# Patient Record
Sex: Female | Born: 1985 | Race: White | Hispanic: No | Marital: Single | Smoking: Former smoker
Health system: Southern US, Community
[De-identification: ages and names within clinical notes are randomized; demographics above are authoritative.]

---
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
## 2007-06-17 IMAGING — CR RIGHT FOOT COMPLETE - 3+ VIEW
1 series · 3 of 3 positions shown · non-contrast
Comparison: none

COMMENTS:

PROCEDURE:     DXR - DXR FOOT RT COMPLETE W/OBLIQUES  - April 27, 2005  [DATE]
RESULT:     There does not appear to be evidence of fracture, dislocation,
or malalignment. If there is persistent clinical concern or persistent
complaints of pain, repeat evaluation in 7-10 days is recommended if
clinically warranted. Note: the previously described lucency along the base
of the distal phalanx of the great toe is not appreciated on this study
though this study was not designed specifically for great toe evaluation as
coned down views of the great toe are.

[Series 1: view not recorded · 0.17mm/px · 3 of 3 slices shown]
[im 1/3]
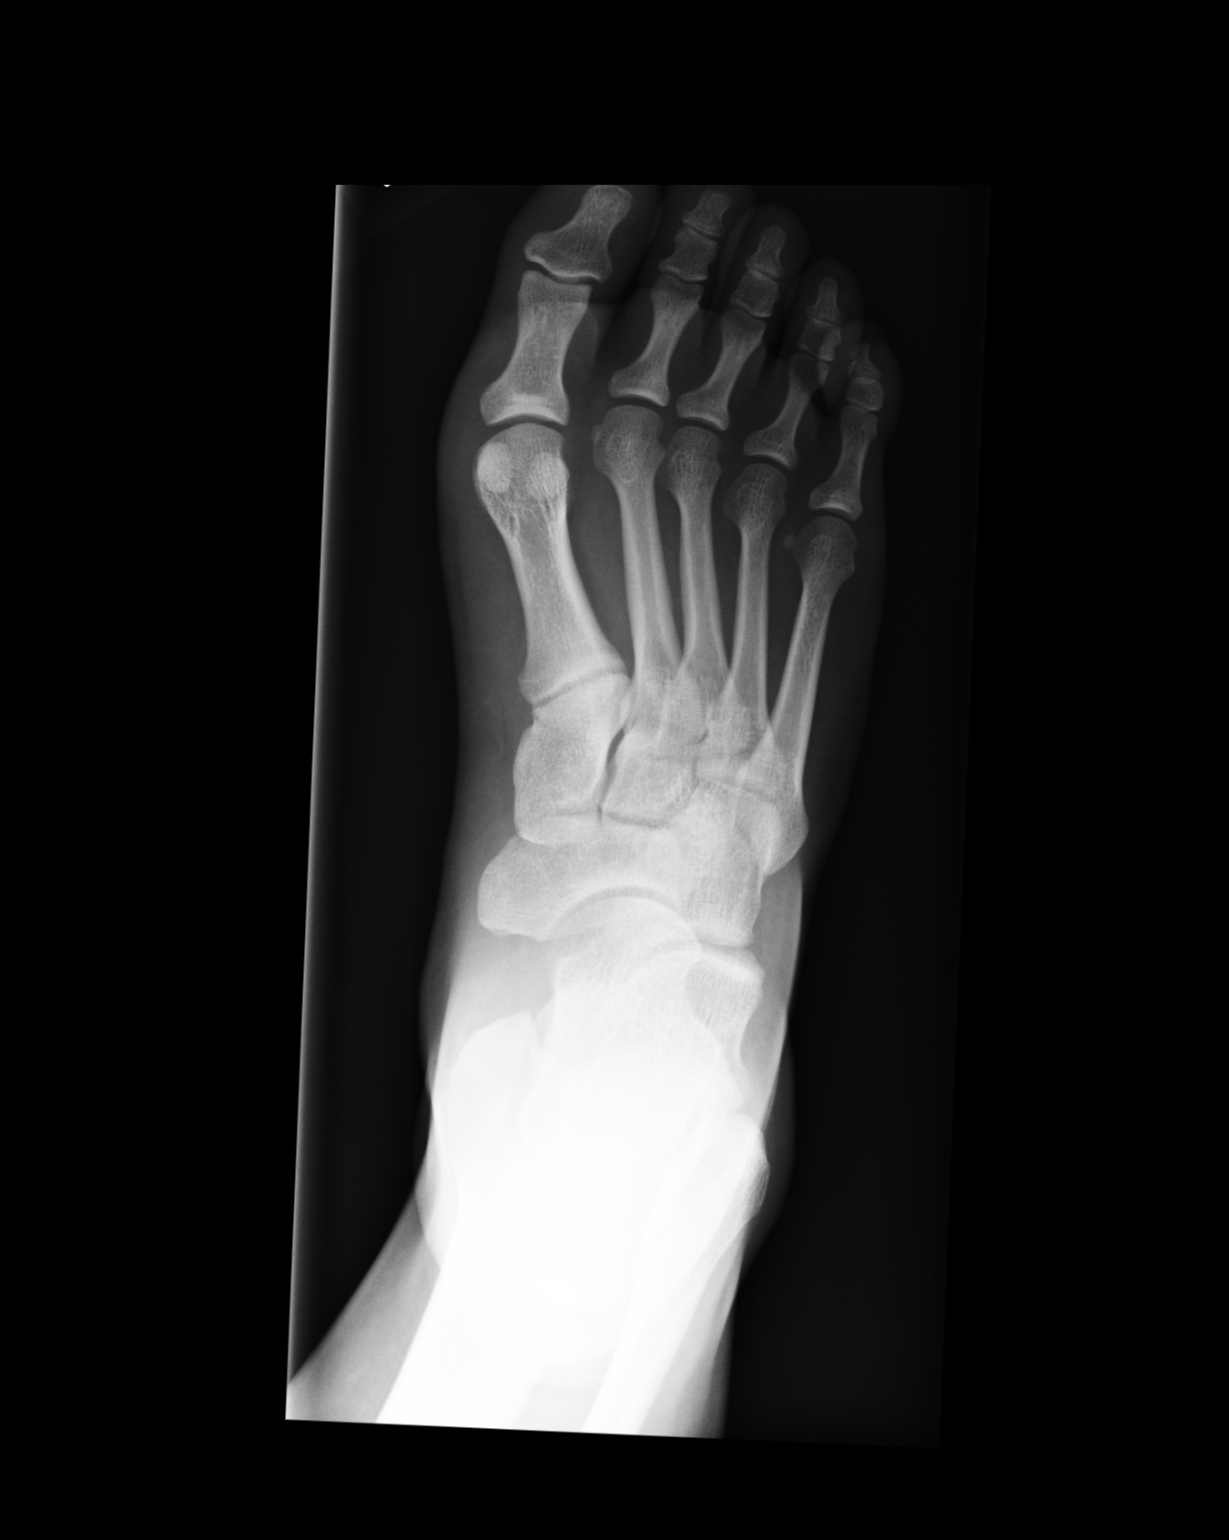
[im 2/3]
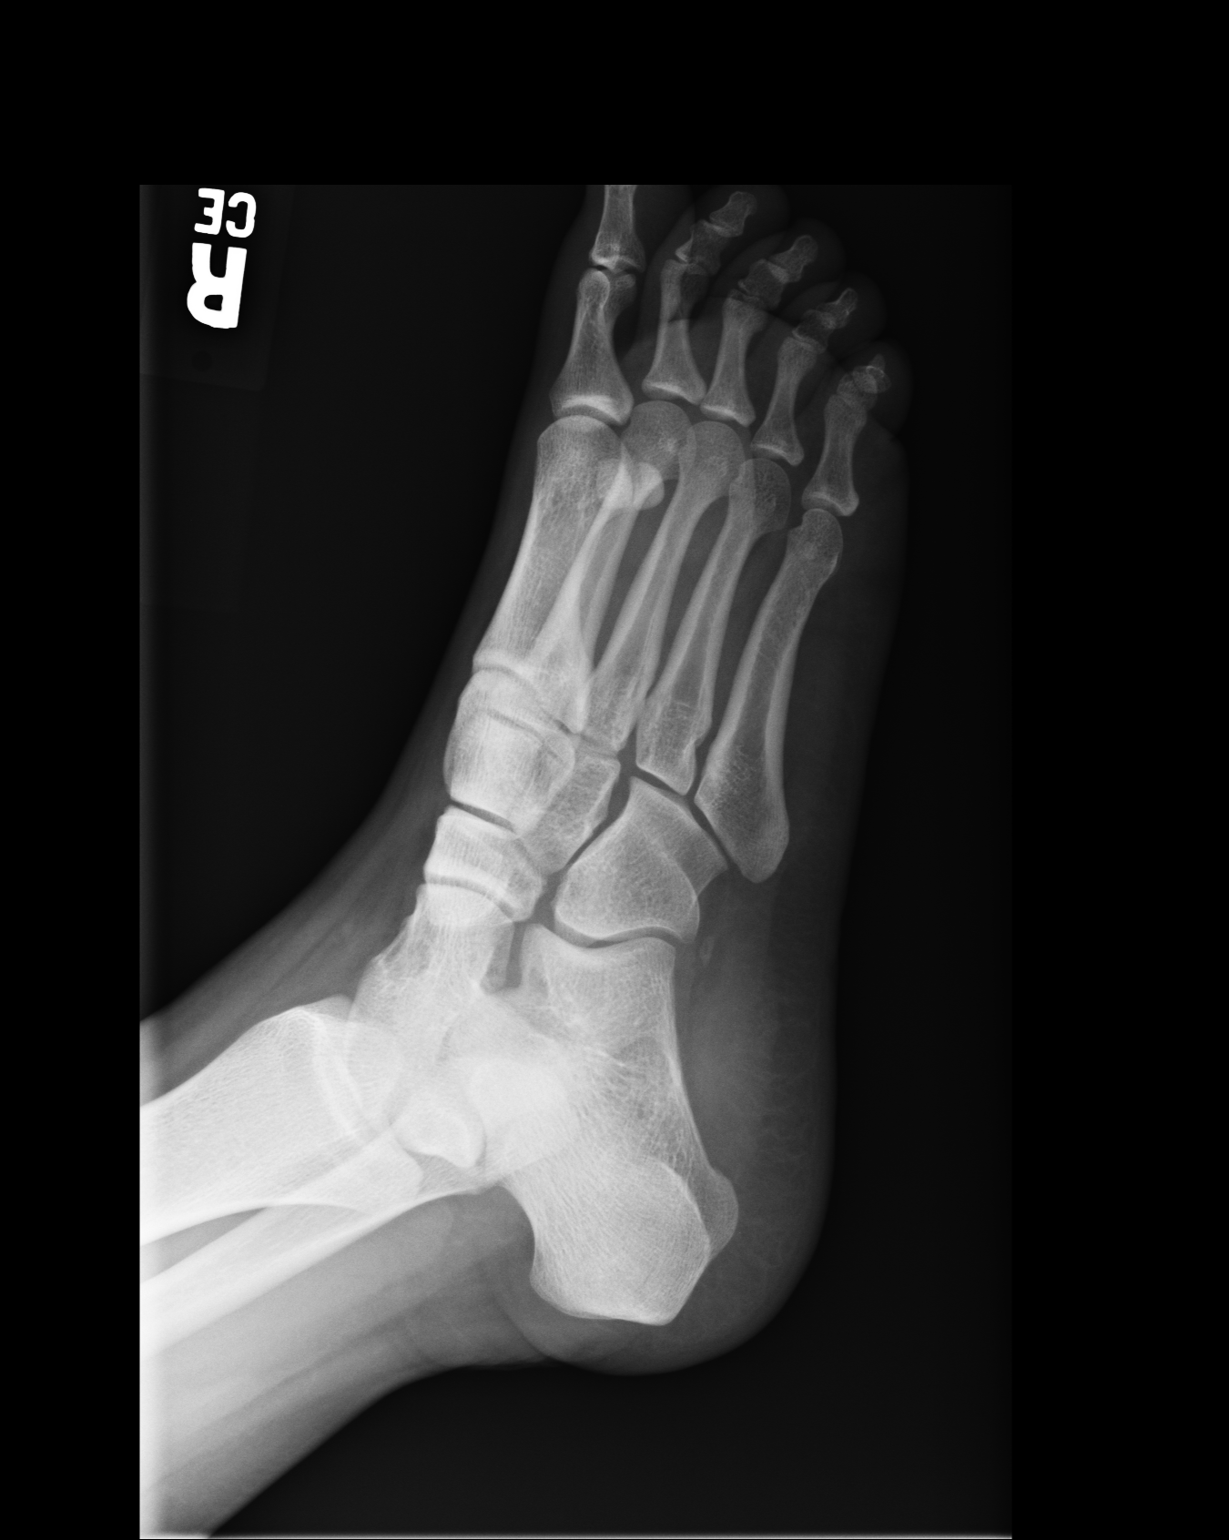
[im 3/3]
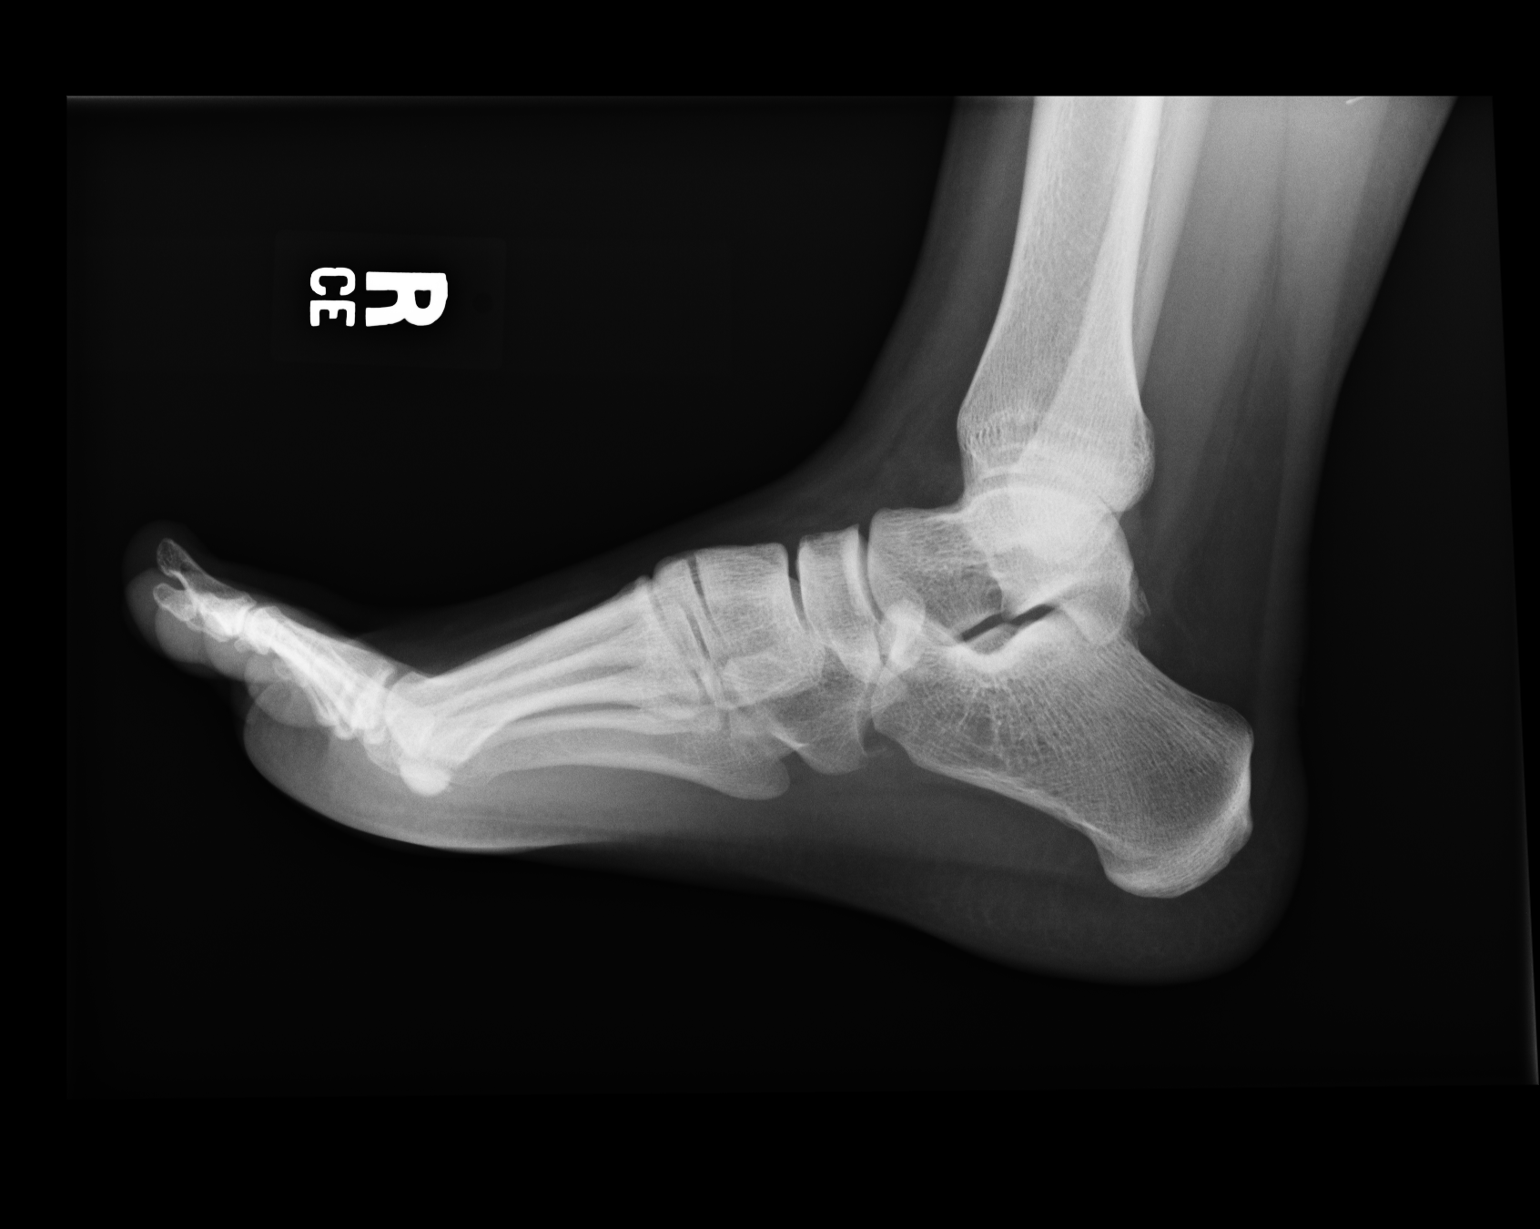

[3 of 3 positions shown; findings below may reference images not displayed]

IMPRESSION: 1)Unremarkable RIGHT foot. Repeat evaluation in 7-10 days is recommended if
and as clinically warranted.

## 2016-07-12 ENCOUNTER — Ambulatory Visit
Admission: EM | Admit: 2016-07-12 | Discharge: 2016-07-12 | Disposition: A | Discharge status: Home / Self Care | Payer: 59 | Attending: Emergency Medicine | Admitting: Emergency Medicine

## 2016-07-12 ENCOUNTER — Encounter: Payer: Self-pay | Admitting: *Deleted

## 2016-07-12 DIAGNOSIS — T7840XA Allergy, unspecified, initial encounter: Secondary | ICD-10-CM | POA: Diagnosis not present

## 2016-07-12 DIAGNOSIS — R220 Localized swelling, mass and lump, head: Secondary | ICD-10-CM | POA: Diagnosis not present

## 2016-07-12 MED ORDER — EPINEPHRINE 0.3 MG/0.3ML IJ SOAJ: 0.3000 mg | Freq: Once | INTRAMUSCULAR | 0 refills | Status: AC

## 2016-07-12 MED ORDER — DIPHENHYDRAMINE HCL 25 MG PO CAPS
25.0000 mg | ORAL_CAPSULE | Freq: Once | ORAL | Status: AC
  Administered 2016-07-12: 25 mg via ORAL

## 2016-07-12 MED ORDER — FAMOTIDINE 20 MG PO TABS
20.0000 mg | ORAL_TABLET | Freq: Once | ORAL | Status: AC
  Administered 2016-07-12: 20 mg via ORAL

## 2016-07-12 MED ORDER — EPINEPHRINE PF 1 MG/ML IJ SOLN
0.3000 mg | Freq: Once | INTRAMUSCULAR | Status: AC
  Administered 2016-07-12: 0.3 mg via SUBCUTANEOUS

## 2016-07-12 MED ORDER — EPINEPHRINE 0.3 MG/0.3ML IJ SOAJ: 0.3000 mg | Freq: Once | INTRAMUSCULAR | Status: DC

## 2016-07-12 NOTE — ED Provider Notes (Signed)
[facility] ____________________________________________  Time seen: Approximately 1208PM  I have reviewed the triage vital signs and the nursing notes.   HISTORY  Chief Complaint Allergic Reaction   HPI PRECIOSA BUNDRICK is a 31 y.o. female  presenting for evaluation of lower lip swelling. Patient reports over the last 2 weeks she has been having intermittent hives. Patient reports on June 15 she had upper lip swelling that after 24 hours and Benadryl finally went away. Patient reports that last night and this morning she was having her normal hives, coming and going, and then began to have onset of lower lip swelling. Patient reports lip swelling started around 9 AM this morning. Patient reports the swelling does seem to be gradually worsening. Denies any acute worsening. Denies any tongue swelling, throat swelling, sore throat, throat irritation, shortness of breath or wheezing. States only lower lip swelling. States highs is much better. States she did take 1 Benadryl at about 7 AM this morning.  Patient reports she and her family have been trying to investigate and see her out what is causing this. Patient states they have not noted throughout her trigger. Denies any changes in foods, medicines, lotions, detergents or other causes. Denies similar in the past outside of last 2 weeks. Denies any changes in anything else that can she can think of. States no changes in anything she does at work, stating she works at [employer]. Patient reports this time otherwise she feels well. Patient denies any insect bite, tick bite or tick attachment.  Denies chest pain, shortness of breath, abdominal pain, dysuria, extremity pain, extremity swelling or rash. Denies recent sickness. Denies recent antibiotic use. Denies any renal or cardiac history.  Patient's last menstrual period was 07/06/2016. Denies pregnancy.   History reviewed. No pertinent past medical history.  There are no  active problems to display for this patient.   History reviewed. No pertinent surgical history.   No current facility-administered medications for this encounter.   Current Outpatient Prescriptions:  .  typhoid (VIVOTIF) DR capsule, Take 1 capsule by mouth every other day., Disp: , Rfl:  .  Atovaquone-Proguanil HCl (MALARONE) 62.5-25 MG tablet, Take 1 tablet by mouth daily., Disp: , Rfl:  .  EPINEPHrine (EPIPEN 2-PAK) 0.3 mg/0.3 mL IJ SOAJ injection, Inject 0.3 mLs (0.3 mg total) into the muscle once., Disp: 1 Device, Rfl: 0  Allergies Methylprednisolone   family history States grandfather had cardiac issues in his 12s. Denies any sudden cardiac death.    Social History Social History  Substance Use Topics  . Smoking status: Former Games developer  . Smokeless tobacco: Never Used  . Alcohol use Yes    Review of Systems Constitutional: No fever/chills Eyes: No visual changes. ENT: No sore throat. Cardiovascular: Denies chest pain. Respiratory: Denies shortness of breath. Gastrointestinal: No abdominal pain.  No nausea, no vomiting.  No diarrhea.  No constipation. Genitourinary: Negative for dysuria. Musculoskeletal: Negative for back pain. Skin: Positive for rash. Neurological: Negative for headaches, focal weakness or numbness.   ____________________________________________   PHYSICAL EXAM:  VITAL SIGNS: ED Triage Vitals  Enc Vitals Group     BP 07/12/16 1154 131/80     Pulse Rate 07/12/16 1154 94     Resp 07/12/16 1154 18     Temp 07/12/16 1154 98.4 F (36.9 C)     Temp Source 07/12/16 1154 Oral     SpO2 07/12/16 1154 100 %     Weight 07/12/16 1155 180 lb (81.6 kg)  Height 07/12/16 1155 5\' 3"  (1.6 m)     Head Circumference --      Peak Flow --      Pain Score 07/12/16 1155 4     Pain Loc --      Pain Edu? --      Excl. in GC? --     Constitutional: Alert and oriented. Well appearing and in no acute distress. Eyes: Conjunctivae are normal. ENT       Head: Normocephalic and atraumatic.       Nose: No congestion/rhinnorhea.      Mouth/Throat: Mucous membranes are moist.Oropharynx non-erythematous. No tongue or uvula or posterior pharyngeal edema noted. No upper lip tenderness or swelling noted. Lower lip diffusely mildly edematous with slight swelling noted just beneath lower lip along chin. No oral lesions. No erythema. No rash noted. Neck: No stridor. Supple without meningismus.  Hematological/Lymphatic/Immunilogical: No cervical lymphadenopathy. Cardiovascular: Normal rate, regular rhythm. Grossly normal heart sounds.  Good peripheral circulation. Respiratory: Normal respiratory effort without tachypnea nor retractions. Breath sounds are clear and equal bilaterally. No wheezes, rales, rhonchi. Speaking complete sentences. Musculoskeletal:   No midline cervical, thoracic or lumbar tenderness to palpation.  Neurologic:  Normal speech and language.  Speech is normal. No gait instability.  Skin:  Skin is warm, dry Except: Left anterior chest mild erythematous hives present, skin intact, no erythema or rash noted. Psychiatric: Mood and affect are normal. Speech and behavior are normal. Patient exhibits appropriate insight and judgment     ___________________________________________   LABS (all labs ordered are listed, but only abnormal results are displayed)  Labs Reviewed - No data to display  PROCEDURES Procedures   INITIAL IMPRESSION / ASSESSMENT AND PLAN / ED COURSE  Pertinent labs & imaging results that were available during my care of the patient were reviewed by me and considered in my medical decision making (see chart for details).  Very well-appearing patient. No acute distress. Patient with intermittent highs in the last 2 weeks, some scattered hives to chest currently with lower lip swelling. No other swelling, wheezing or chest tightness other complaints noted. Patient states that she is allergic to methylprednisolone  stating it causes her nausea, dizziness and palpitations. Patient states she does not want to take any medicine similar to this. Patient denies any cardiac history. 0.3 mg subcutaneous epi, 25 mg Benadryl and 20 mg Pepcid given once in urgent care. Will continue to monitor patient.  1240 patient reports still doing well. Reports continues of lip swelling, no change.  1pm patient reports improvement to chin and lip swelling, but swelling still present. Recommend to continue to monitor patient, patient states that she needs to leave at 1:30 to go to work.  130 patient appears stable. Chin swelling appears to have improved, lip swelling still present, but per patient both have improved. Patient denies any other complaints or symptoms. Lungs clear throughout. No distress noted. Encouraged to continue to monitor patient, patient's extremities ago. Discussed with patient to take over-the-counter Benadryl 3-4 times per day, Pepcid over-the-counter twice a day 5 days, and then afterwards start taking Claritin or Zyrtec daily. EpiPen given and counseled regarding its use. Discussed strict follow-up and return parameters. Encourage allergy testing. Patient signed out AMA as she needs to leave prior to completion of evaluation.  Discussed follow up with Primary care physician this week. Discussed follow up and return parameters including no resolution or any worsening concerns. Patient verbalized understanding and agreed to plan.   ____________________________________________  FINAL CLINICAL IMPRESSION(S) / ED DIAGNOSES  Final diagnoses:  Lip swelling  Allergic reaction, initial encounter     Discharge Medication List as of 07/12/2016  1:38 PM    START taking these medications   Details  EPINEPHrine (EPIPEN 2-PAK) 0.3 mg/0.3 mL IJ SOAJ injection Inject 0.3 mLs (0.3 mg total) into the muscle once., Starting Wed 07/12/2016, Normal        Note: This dictation was prepared with Dragon dictation along  with smaller phrase technology. Any transcriptional errors that result from this process are unintentional.           Renford DillsMiller, Tanish Sinkler, NP 07/12/16 1520

## 2016-07-12 NOTE — ED Triage Notes (Signed)
Patient started having lower lip swelling with generalized hives this AM. Patient had upper lip swelling on 07/07/16. Patient tried benadryl with no resolution of symptoms.

## 2016-07-12 NOTE — Discharge Instructions (Signed)
Drink plenty of fluids. Continue to monitor and try to identify any possible triggers. Follow-up for allergy testing. This is very important. Take over-the-counter Pepcid or Benadryl twice a day for 5 days. Continue to take home Benadryl 3 times a day as needed. Then continue to take over-the-counter Zyrtec or Claritin daily until follow-up.  Follow up with your primary care physician this week. Return to Urgent care or emergency room for lip or tongue swelling, throat swelling, wheezing, shortness of breath, new or worsening concerns.

## 2018-09-24 ENCOUNTER — Telehealth: Payer: Self-pay | Admitting: Family Medicine

## 2018-09-24 NOTE — Telephone Encounter (Signed)
Patient wants an appointment for IMM, but wants to confirm that we do have varicella vaccine first. She has called different places and they are out of them and does not want to come here for an appointment and be told we don't have it as well.

## 2018-09-24 NOTE — Telephone Encounter (Signed)
TC with patient.  Reports needs varicella vaccine. Patient scheduled for IMM appt.Aileen Fass, RN

## 2018-09-27 ENCOUNTER — Ambulatory Visit: Payer: Self-pay

## 2018-10-02 ENCOUNTER — Ambulatory Visit (LOCAL_COMMUNITY_HEALTH_CENTER): Payer: 59

## 2018-10-02 ENCOUNTER — Other Ambulatory Visit: Payer: Self-pay

## 2018-10-02 DIAGNOSIS — Z23 Encounter for immunization: Secondary | ICD-10-CM | POA: Diagnosis not present

## 2018-10-02 NOTE — Progress Notes (Signed)
Pt requesting varicella vaccine. Pt states she has never had chicken pox/varicella virus. Pt states that she has called her pediatrician and doctors offices and can not find any records stating she has had varicella vaccines.

## 2018-10-30 ENCOUNTER — Other Ambulatory Visit: Payer: Self-pay

## 2018-10-30 ENCOUNTER — Ambulatory Visit (LOCAL_COMMUNITY_HEALTH_CENTER): Payer: 59

## 2018-10-30 DIAGNOSIS — Z23 Encounter for immunization: Secondary | ICD-10-CM

## 2019-10-30 ENCOUNTER — Other Ambulatory Visit: Payer: Self-pay

## 2019-10-30 ENCOUNTER — Telehealth: Payer: Self-pay

## 2019-10-30 DIAGNOSIS — R7611 Nonspecific reaction to tuberculin skin test without active tuberculosis: Secondary | ICD-10-CM

## 2019-11-04 DIAGNOSIS — R7611 Nonspecific reaction to tuberculin skin test without active tuberculosis: Secondary | ICD-10-CM | POA: Insufficient documentation

## 2019-11-04 NOTE — Telephone Encounter (Signed)
TC from patient.  Reports she had CXR done somewhere else.  States she needed ASAP for clinicals.  Co LTBI vs Active. Discussed that TB RN noticed she has worked at [employer] and that would be considered a risk factor.  Patient declines TB services with [facility]; refused evaluation.  Instructed to call if she changes her mind. Richmond Campbell, RN

## 2022-08-17 ENCOUNTER — Ambulatory Visit
Admission: RE | Admit: 2022-08-17 | Discharge: 2022-08-17 | Disposition: A | Discharge status: Home / Self Care | Payer: 59 | Source: Ambulatory Visit | Attending: Emergency Medicine | Admitting: Emergency Medicine

## 2022-08-17 ENCOUNTER — Ambulatory Visit (INDEPENDENT_AMBULATORY_CARE_PROVIDER_SITE_OTHER): Payer: 59

## 2022-08-17 VITALS — BP 133/86 | HR 68 | Temp 98.3°F | Resp 16

## 2022-08-17 DIAGNOSIS — R7611 Nonspecific reaction to tuberculin skin test without active tuberculosis: Secondary | ICD-10-CM | POA: Diagnosis not present

## 2022-08-17 NOTE — Discharge Instructions (Signed)
Chest x-ray is pending, you will be able to see the results on your MyChart account once active  Any positive test results will be called to you  In the future it is recommended that she always get a chest x-ray for confirmation as PPD will most likely be positive

## 2022-08-17 NOTE — ED Provider Notes (Signed)
[facility]    CSN: [id number] Arrival date & time: 08/17/22  1605      History   Chief Complaint Chief Complaint  Patient presents with   PPD Reading    Needing CXRAY following positive PPD skin test - Entered by patient    HPI Angela Escobar is a 37 y.o. female.   Patient presents requesting chest x-ray after positive PPD skin test at job.  Required to return to work.  Denies all symptoms.  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  History reviewed. No pertinent past medical history.  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 No pertinent surgical history.  OB History   No obstetric history on file.      Home Medications    Prior to Admission medications   Medication Sig Start Date End Date Taking? Authorizing Provider  Atovaquone-Proguanil HCl (MALARONE) 62.5-25 MG tablet Take 1 tablet by mouth daily.    [provider]  typhoid (VIVOTIF) DR capsule Take 1 capsule by mouth every other day.    [provider]    Family History No family history on file.  Social History Social History   Tobacco Use   Smoking status: Former   Smokeless tobacco: Never  Advertising account planner   Vaping status: Never Used  Substance Use Topics   Alcohol use: Yes   Drug use: No     Allergies   Methylprednisolone   Review of Systems Review of Systems  Constitutional: Negative.   HENT: Negative.    Respiratory: Negative.    Cardiovascular: Negative.   Gastrointestinal: Negative.   Skin: Negative.   Neurological: Negative.      Physical Exam Triage Vital Signs ED Triage Vitals [08/17/22 1628]  Encounter Vitals Group     BP 133/86     Systolic BP Percentile      Diastolic BP Percentile      Pulse Rate 68     Resp 16     Temp 98.3 F (36.8 C)     Temp Source Oral     SpO2 100 %     Weight      Height      Head Circumference      Peak Flow       Pain Score 0     Pain Loc      Pain Education      Exclude from Growth Chart    No data found.  Updated Vital Signs BP 133/86 (BP Location: Left Arm)   Pulse 68   Temp 98.3 F (36.8 C) (Oral)   Resp 16   LMP 08/13/2022   SpO2 100%   Visual Acuity Right Eye Distance:   Left Eye Distance:   Bilateral Distance:    Right Eye Near:   Left Eye Near:    Bilateral Near:     Physical Exam Constitutional:      Appearance: Normal appearance.  Eyes:     Extraocular Movements: Extraocular movements intact.  Pulmonary:     Effort: Pulmonary effort is normal.  Neurological:     Mental Status: She is alert and oriented to person, place, and time. Mental status is at baseline.      UC Treatments / Results  Labs (all labs ordered are listed, but only abnormal results are displayed) Labs Reviewed - No data to display  EKG   Radiology  No results found.  Procedures Procedures (including critical care time)  Medications Ordered in UC Medications - No data to display  Initial Impression / Assessment and Plan / UC Course  I have reviewed the triage vital signs and the nursing notes.  Pertinent labs & imaging results that were available during my care of the patient were reviewed by me and considered in my medical decision making (see chart for details).  Positive PPD  Vital signs are stable patient is in no signs of distress nontoxic-appearing, pulmonary effort within normal limits, chest x-ray pending, may follow-up as needed, if chest x-ray positive will report findings to the local health department as well as refer to infectious disease Final Clinical Impressions(s) / UC Diagnoses   Final diagnoses:  Positive PPD     Discharge Instructions      Chest x-ray is pending, you will be able to see the results on your MyChart account once active  Any positive test results will be called to you  In the future it is recommended that she always get a chest x-ray for  confirmation as PPD will most likely be positive   ED Prescriptions   None    PDMP not reviewed this encounter.   Valinda Hoar, [place] 08/17/22 [phone]

## 2022-08-17 NOTE — ED Triage Notes (Signed)
Pt had a positive TB test a few days ago and needs an xray for work to determine TB status.
